# Patient Record
Sex: Female | Born: 1945 | Race: White | Hispanic: No | Marital: Married | Smoking: Former smoker
Health system: Southern US, Community
[De-identification: ages and names within clinical notes are randomized; demographics above are authoritative.]

## PROBLEM LIST (undated history)

## (undated) HISTORY — PX: THYROIDECTOMY: SHX17

---

## 1991-07-06 HISTORY — PX: ABDOMINAL HYSTERECTOMY: SHX81

## 2010-05-22 ENCOUNTER — Emergency Department (HOSPITAL_COMMUNITY): Admission: EM | Admit: 2010-05-22 | Discharge: 2010-05-23 | Payer: Self-pay | Admitting: Emergency Medicine

## 2010-05-24 ENCOUNTER — Ambulatory Visit: Payer: Self-pay | Admitting: Pulmonary Disease

## 2010-05-24 ENCOUNTER — Inpatient Hospital Stay (HOSPITAL_COMMUNITY)
Admission: EM | Admit: 2010-05-24 | Discharge: 2010-05-26 | Payer: Self-pay | Source: Home / Self Care | Admitting: Internal Medicine

## 2010-05-24 ENCOUNTER — Encounter: Payer: Self-pay | Admitting: Emergency Medicine

## 2010-05-26 ENCOUNTER — Encounter: Payer: Self-pay | Admitting: Internal Medicine

## 2010-09-15 LAB — BLOOD GAS, ARTERIAL
O2 Saturation: 90.5 %
Patient temperature: 98.6
TCO2: 23.4 mmol/L (ref 0–100)
pH, Arterial: 7.495 — ABNORMAL HIGH (ref 7.350–7.400)

## 2010-09-15 LAB — COMPREHENSIVE METABOLIC PANEL
ALT: 26 U/L (ref 0–35)
AST: 27 U/L (ref 0–37)
Alkaline Phosphatase: 53 U/L (ref 39–117)
CO2: 30 mEq/L (ref 19–32)
Chloride: 101 mEq/L (ref 96–112)
Creatinine, Ser: 0.94 mg/dL (ref 0.4–1.2)
GFR calc Af Amer: >60 mL/min (ref >60)
GFR calc non Af Amer: 60 mL/min — ABNORMAL LOW (ref >60)
Potassium: 5.2 mEq/L — ABNORMAL HIGH (ref 3.5–5.1)
Sodium: 141 mEq/L (ref 135–145)
Total Bilirubin: 0.5 mg/dL (ref 0.3–1.2)

## 2010-09-15 LAB — BASIC METABOLIC PANEL
BUN: 10 mg/dL (ref 6–23)
BUN: 20 mg/dL (ref 6–23)
CO2: 27 mEq/L (ref 19–32)
Calcium: 9.3 mg/dL (ref 8.4–10.5)
Calcium: 9.3 mg/dL (ref 8.4–10.5)
Calcium: 9.6 mg/dL (ref 8.4–10.5)
Creatinine, Ser: 0.93 mg/dL (ref 0.4–1.2)
GFR calc Af Amer: >60 mL/min (ref >60)
GFR calc non Af Amer: 49 mL/min — ABNORMAL LOW (ref >60)
GFR calc non Af Amer: >60 mL/min (ref >60)
GFR calc non Af Amer: >60 mL/min (ref >60)
Glucose, Bld: 105 mg/dL — ABNORMAL HIGH (ref 70–99)
Glucose, Bld: 107 mg/dL — ABNORMAL HIGH (ref 70–99)
Glucose, Bld: 147 mg/dL — ABNORMAL HIGH (ref 70–99)
Sodium: 138 mEq/L (ref 135–145)

## 2010-09-15 LAB — DIFFERENTIAL
Basophils Absolute: 0.1 10*3/uL (ref 0.0–0.1)
Basophils Relative: 1 % (ref 0–1)
Monocytes Absolute: 0.7 10*3/uL (ref 0.1–1.0)
Neutro Abs: 7.9 10*3/uL — ABNORMAL HIGH (ref 1.7–7.7)
Neutrophils Relative %: 75 % (ref 43–77)

## 2010-09-15 LAB — CBC
HCT: 39.3 % (ref 36.0–46.0)
Hemoglobin: 13.3 g/dL (ref 12.0–15.0)
MCH: 29.3 pg (ref 26.0–34.0)
MCH: 29.5 pg (ref 26.0–34.0)
MCHC: 33 g/dL (ref 30.0–36.0)
MCHC: 33.5 g/dL (ref 30.0–36.0)
MCHC: 35 g/dL (ref 30.0–36.0)
Platelets: 343 10*3/uL (ref 150–400)
RBC: 4.54 MIL/uL (ref 3.87–5.11)
RDW: 14.8 % (ref 11.5–15.5)
RDW: 14.9 % (ref 11.5–15.5)

## 2010-09-15 LAB — D-DIMER, QUANTITATIVE: D-Dimer, Quant: 0.22 ug/mL-FEU (ref 0.00–0.48)

## 2010-09-24 ENCOUNTER — Other Ambulatory Visit (HOSPITAL_COMMUNITY): Payer: Self-pay | Admitting: Surgery

## 2010-09-24 DIAGNOSIS — IMO0001 Reserved for inherently not codable concepts without codable children: Secondary | ICD-10-CM

## 2010-09-25 ENCOUNTER — Ambulatory Visit (HOSPITAL_COMMUNITY)
Admission: RE | Admit: 2010-09-25 | Discharge: 2010-09-25 | Disposition: A | Discharge status: Home / Self Care | Payer: No Typology Code available for payment source | Source: Ambulatory Visit | Attending: Surgery | Admitting: Surgery

## 2010-09-25 ENCOUNTER — Other Ambulatory Visit (HOSPITAL_COMMUNITY): Payer: Self-pay | Admitting: Surgery

## 2010-09-25 DIAGNOSIS — R109 Unspecified abdominal pain: Secondary | ICD-10-CM | POA: Insufficient documentation

## 2010-09-25 DIAGNOSIS — IMO0001 Reserved for inherently not codable concepts without codable children: Secondary | ICD-10-CM

## 2010-09-25 DIAGNOSIS — K219 Gastro-esophageal reflux disease without esophagitis: Secondary | ICD-10-CM | POA: Insufficient documentation

## 2010-12-01 ENCOUNTER — Encounter (HOSPITAL_COMMUNITY): Payer: No Typology Code available for payment source

## 2010-12-01 ENCOUNTER — Other Ambulatory Visit (INDEPENDENT_AMBULATORY_CARE_PROVIDER_SITE_OTHER): Payer: Self-pay | Admitting: Surgery

## 2010-12-01 LAB — DIFFERENTIAL
Basophils Relative: 0 % (ref 0–1)
Eosinophils Absolute: 0.1 10*3/uL (ref 0.0–0.7)
Monocytes Relative: 9 % (ref 3–12)
Neutro Abs: 5.8 10*3/uL (ref 1.7–7.7)
Neutrophils Relative %: 70 % (ref 43–77)

## 2010-12-01 LAB — SURGICAL PCR SCREEN
MRSA, PCR: NEGATIVE
Staphylococcus aureus: NEGATIVE

## 2010-12-01 LAB — CBC
Hemoglobin: 15.5 g/dL — ABNORMAL HIGH (ref 12.0–15.0)
MCH: 28.3 pg (ref 26.0–34.0)
Platelets: 262 10*3/uL (ref 150–400)
RBC: 5.48 MIL/uL — ABNORMAL HIGH (ref 3.87–5.11)
WBC: 8.3 10*3/uL (ref 4.0–10.5)

## 2010-12-01 LAB — COMPREHENSIVE METABOLIC PANEL
ALT: 31 U/L (ref 0–35)
AST: 17 U/L (ref 0–37)
Albumin: 3.7 g/dL (ref 3.5–5.2)
Calcium: 9.5 mg/dL (ref 8.4–10.5)
Creatinine, Ser: 0.87 mg/dL (ref 0.4–1.2)
GFR calc Af Amer: >60 mL/min (ref >60)
GFR calc non Af Amer: >60 mL/min (ref >60)
Sodium: 137 mEq/L (ref 135–145)
Total Protein: 7 g/dL (ref 6.0–8.3)

## 2010-12-01 LAB — APTT: aPTT: 23 seconds — ABNORMAL LOW (ref 24–37)

## 2010-12-01 LAB — PROTIME-INR
INR: 0.92 (ref 0.00–1.49)
Prothrombin Time: 12.6 seconds (ref 11.6–15.2)

## 2010-12-01 LAB — CEA: CEA: 2.8 ng/mL (ref 0.0–5.0)

## 2010-12-02 ENCOUNTER — Observation Stay (HOSPITAL_COMMUNITY)
Admission: RE | Admit: 2010-12-02 | Discharge: 2010-12-05 | DRG: 419 | Disposition: A | Discharge status: Home / Self Care | Payer: No Typology Code available for payment source | Source: Ambulatory Visit | Attending: Surgery | Admitting: Surgery

## 2010-12-02 ENCOUNTER — Other Ambulatory Visit (INDEPENDENT_AMBULATORY_CARE_PROVIDER_SITE_OTHER): Payer: Self-pay | Admitting: Surgery

## 2010-12-02 ENCOUNTER — Ambulatory Visit (HOSPITAL_COMMUNITY): Payer: No Typology Code available for payment source

## 2010-12-02 DIAGNOSIS — K219 Gastro-esophageal reflux disease without esophagitis: Principal | ICD-10-CM | POA: Insufficient documentation

## 2010-12-02 DIAGNOSIS — R05 Cough: Secondary | ICD-10-CM | POA: Insufficient documentation

## 2010-12-02 DIAGNOSIS — K8020 Calculus of gallbladder without cholecystitis without obstruction: Secondary | ICD-10-CM | POA: Insufficient documentation

## 2010-12-02 DIAGNOSIS — Z79899 Other long term (current) drug therapy: Secondary | ICD-10-CM | POA: Insufficient documentation

## 2010-12-02 DIAGNOSIS — J3800 Paralysis of vocal cords and larynx, unspecified: Secondary | ICD-10-CM | POA: Insufficient documentation

## 2010-12-02 DIAGNOSIS — J45909 Unspecified asthma, uncomplicated: Secondary | ICD-10-CM | POA: Insufficient documentation

## 2010-12-02 DIAGNOSIS — R059 Cough, unspecified: Secondary | ICD-10-CM | POA: Insufficient documentation

## 2010-12-02 DIAGNOSIS — IMO0002 Reserved for concepts with insufficient information to code with codable children: Secondary | ICD-10-CM | POA: Insufficient documentation

## 2010-12-02 DIAGNOSIS — Z01812 Encounter for preprocedural laboratory examination: Secondary | ICD-10-CM | POA: Insufficient documentation

## 2010-12-02 DIAGNOSIS — Z85850 Personal history of malignant neoplasm of thyroid: Secondary | ICD-10-CM | POA: Insufficient documentation

## 2010-12-02 LAB — POTASSIUM: Potassium: 3 mEq/L — ABNORMAL LOW (ref 3.5–5.1)

## 2010-12-03 ENCOUNTER — Inpatient Hospital Stay (HOSPITAL_COMMUNITY): Payer: No Typology Code available for payment source

## 2010-12-03 LAB — CBC
Hemoglobin: 13.5 g/dL (ref 12.0–15.0)
MCH: 28.6 pg (ref 26.0–34.0)
MCHC: 32.7 g/dL (ref 30.0–36.0)
Platelets: 227 10*3/uL (ref 150–400)

## 2010-12-03 MED ORDER — IOHEXOL 300 MG/ML  SOLN
50.0000 mL | Freq: Once | INTRAMUSCULAR | Status: AC | PRN
  Administered 2010-12-03: 50 mL via INTRAVENOUS

## 2010-12-04 LAB — DIFFERENTIAL
Basophils Absolute: 0 10*3/uL (ref 0.0–0.1)
Basophils Relative: 0 % (ref 0–1)
Eosinophils Absolute: 0 10*3/uL (ref 0.0–0.7)
Monocytes Relative: 5 % (ref 3–12)
Neutro Abs: 6 10*3/uL (ref 1.7–7.7)
Neutrophils Relative %: 88 % — ABNORMAL HIGH (ref 43–77)

## 2010-12-04 LAB — CBC
Hemoglobin: 13.1 g/dL (ref 12.0–15.0)
Platelets: 209 10*3/uL (ref 150–400)
RBC: 4.51 MIL/uL (ref 3.87–5.11)
WBC: 6.8 10*3/uL (ref 4.0–10.5)

## 2010-12-04 LAB — BASIC METABOLIC PANEL
CO2: 28 mEq/L (ref 19–32)
Chloride: 102 mEq/L (ref 96–112)
Creatinine, Ser: 0.65 mg/dL (ref 0.4–1.2)
GFR calc Af Amer: >60 mL/min (ref >60)
Potassium: 4.2 mEq/L (ref 3.5–5.1)
Sodium: 138 mEq/L (ref 135–145)

## 2010-12-16 NOTE — H&P (Signed)
[name], Hinton          ACCOUNT NO.:  [id number]  MEDICAL RECORD NO.:  [id number]           PATIENT TYPE:  I  LOCATION:  1532                         FACILITY:  [facility]  PHYSICIAN:  Thornton Park. Daphine Deutscher, MD  DATE OF BIRTH:  July 24, 1945  DATE OF ADMISSION:  12/02/2010 DATE OF DISCHARGE:                             HISTORY & PHYSICAL   CHIEF COMPLAINT:  Gastroesophageal induced asthma.  HISTORY:  Desiree Hinton is a 65 year old white female who currently lives in [place], [place], and is followed by Dr. Virgina Evener in [place], [place], who has had reflux for some time of the laryngopharyngeal kind.  This has aggravated a problem from a previous completion thyroidectomy for papillary follicular cancer that left Desiree Hinton with some vocal cord weakness.  Desiree Hinton has had this probably GERD induced asthma for which Desiree Hinton has taken fairly significant dose of prednisone, aggravated earlier this year being as much as 60 mg a day, currently down to around 20 a day.  Desiree Hinton daughter works in the [employer] and Desiree Hinton desired have a Nissen down here in [place].  Preoperative upper GI showed free reflux but no significant structural hiatal hernia.  Desiree Hinton also on ultrasound was found to have gallstones.  We discussed laparoscopic cholecystectomy and laparoscopic Nissen fundoplication in the holding area at the time  before surgery and we decided to proceed with both.  Informed consent was obtained.  PAST MEDICAL HISTORY:  Desiree Hinton has had previous total thyroidectomy in March and subsequently in July 2008.  Desiree Hinton has had bilateral knee replacements in [place].  Desiree Hinton had a hysterectomy in 1993 and Desiree Hinton has required ICU admissions for significant aspiration issues.  CURRENT MEDICATIONS:  Include hydrochlorothiazide 50 mg daily, Synthroid 150 mcg daily, Nexium 40 mg b.i.d., ranitidine 300 mg h.s. Symbicort 2 puffs b.i.d., prednisone 20 mg daily, estradiol patch 0.025 mg weekly.  ALLERGIES:  Desiree Hinton  lists to [facility] which produce vomiting issues; THEOPHYLLINE, tremors and headaches.  Desiree Hinton also was taking the brand Synthroid and another generic substitute. Desiree Hinton is not allergic to latex.  Does not take Coumadin, Plavix or aspirin.  FAMILY HISTORY:  Father deceased at age 13.  Mother is living, healthy, mild memory impairment.  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  Desiree Hinton has 2  children.  Does not smoke and drink.  Fifteen-point review of systems is remarkable for cough, shortness of breath, thyroid disease, sinus problems, hoarseness and previous thyroid cancer.  Also significant GERD as outlined earlier in the HPI.  PHYSICAL EXAMINATION:  VITAL SIGNS:  Physical examination shows Desiree Hinton is afebrile, pulse rate 75, blood pressure 150/80. GENERAL:  Well-developed, pleasant white female, in no acute distress. Desiree Hinton does have somewhat residual cough. HEENT EXAM:  Unremarkable. NECK:  Had previous thyroidectomy incision. CHEST:  Clear to auscultation today. HEART:  Sinus rhythm without murmurs, rubs or gallops. ABDOMEN:  Nontender.  Desiree Hinton has few scars from previous laparoscopy but no upper abdominal incisions. EXTREMITIES:  Have full range of motion.  Has had bilateral knee replacements. NEURO:  Alert, oriented x3.  Motor and sensory function grossly intact. PSYCH:  Appropriate affect.  Good fund  of knowledge.  Has now researched the procedure well.  IMPRESSION:  Significant gastroesophageal reflux in a patient with partial vocal cord paralysis who is prone to and has developed significant gastroesophageal reflux disease induced asthma.  Plan is laparoscopic Nissen fundoplication.  Also gallstones with a band-like pains suggesting symptomatic cholelithiasis.  Plan is laparoscopic cholecystectomy.     Thornton Park Daphine Deutscher, MD     MBM/MEDQ  D:  12/03/2010  T:  12/03/2010  Job:  [id number]  Electronically Signed by Luretha Murphy MD on 12/16/2010 04:32:50  PM

## 2010-12-16 NOTE — Op Note (Signed)
NAME:  NEISHA, HINGER          ACCOUNT NO.:  [id number]  MEDICAL RECORD NO.:  [id number]           PATIENT TYPE:  O  LOCATION:  DAYL                         FACILITY:  [facility]  PHYSICIAN:  Thornton Park. Daphine Deutscher, MD  DATE OF BIRTH:  [birth year]  DATE OF PROCEDURE:  12/02/2010 DATE OF DISCHARGE:                              OPERATIVE REPORT   PREOPERATIVE INDICATIONS:  Laryngopharyngeal reflux with recurrent aspiration and gallstones.  HISTORY:  Ms. Kramar is a 65 year old white female from [place] who I saw in the office on September 24, 2010, regarding her laryngopharyngeal reflux.  She had a previous thyroidectomy for papillary follicular cancer leaving her worse with vocal cord weakness.  In addition, she has had aspirations that have become very symptomatic and this led to aspiration pneumonia and adult onset asthma-like problems.  Informed consent was obtained regarding fundoplication.  In light of her gallstones, which were found on some of the tests that I ordered, we talked in the holding area about going ahead and doing a laparoscopic cholecystectomy at the same time and she wanted to do this.  PROCEDURE:  Laparoscopic Nissen fundoplication with a 56 bougie in place, 1 suture pledgeted closure of the hiatus having been calibrated with a 56 and a 3 suture wrap.  Laparoscopic cholecystectomy with an intraoperative cholangiogram.  SURGEON:  Thornton Park. Daphine Deutscher, MD  ASSISTANT:  Lorne Skeens. Hoxworth, MD  ANESTHESIA:  General endotracheal.  DESCRIPTION OF PROCEDURE:  This lady was taken to room #1 on Wednesday, Dec 02, 2010, and given general anesthesia.  The abdomen was prepped with PCMX and draped sterilely.  I entered the abdomen through the left upper quadrant using a 5 mm 0-degree Optiview technique without difficulty.  The abdomen was insufflated and standard trocar placements were used initially for the Nissen portion of the procedure.  I used an 11 on the right side and  a 5 more laterally.  A Nathanson retractor was inserted to achieve visibility beneath the liver.  I used a Harmonic scalpel to take down the gastrohepatic window and go up on the top of the esophagus.  I went over and took down short gastrics working my way up to the left crus and opened that up as well, identifying the left and right crus.  I went posteriorly and created a window, put a Penrose drain around and held down on this to complete the dissection.  We then inserted a lighted 56 bougie, and with that in place, I used that to help me calibrate the closure of the hiatus and this just took 1 stitch with pledgets on both sides secured with a tie knot.  Wrap was brought around over the 56 and again secured with 3 sutures getting purchases of esophagus along with each and then it was secured with a tie knot.  The bougie was withdrawn and the repair looked good.  We were able to grasp the gallbladder with the lateral most trocar on the right and elevate it.  We then achieved a critical view and I dissected free Calot triangle.  The gallbladder itself had lot of fatty infiltration, was kind of white, opalescent.  Cystic  duct was identified, clipped proximally on the gallbladder, incised and a Reddick catheter was inserted.  A dynamic cholangiogram revealed prompt filling of the intrahepatic radicals and free flow into the duodenum.  No stones were noted.  Cystic duct was triple clipped, divided.  Cystic artery was triple clipped and divided with the hook electrocautery.  The gallbladder was then teased away from the gallbladder and dissected out with a right-angle Bovie and it was not entered.  It was then placed in a bag and brought out through the 10 mm port.  The port sites were injected with a new long-acting analgesic __________.  The wounds were closed with 4-0 Vicryl with Benzoin and Steri-Strips.  The patient tolerated the procedure well and was taken to recovery room in  satisfactory condition.     Thornton Park Daphine Deutscher, MD     MBM/MEDQ  D:  12/02/2010  T:  12/02/2010  Job:  [id number]  cc:   Dr. Tedd Sias, [place]  Electronically Signed by Luretha Murphy MD on 12/16/2010 04:32:43 PM

## 2011-01-05 NOTE — Discharge Summary (Signed)
  NAMESHALINA, NORFOLK NO.:  [id number]  MEDICAL RECORD NO.:  [id number]  LOCATION:  1532                         FACILITY:  [facility]  PHYSICIAN:  Thornton Park. Daphine Deutscher, MD  DATE OF BIRTH:  1945-11-30  DATE OF ADMISSION:  12/02/2010 DATE OF DISCHARGE:  12/05/2010                              DISCHARGE SUMMARY   ADMITTING DIAGNOSIS:  Gastroesophageal reflux disease.  POSTOPERATIVE DIAGNOSIS:  Gastroesophageal reflux disease.  PROCEDURE:  Laparoscopic Nissen fundoplication.  HISTORY:  This 65 year old white female comes to [place] from [place], [place] for Nissen fundoplication.  She was taken to the operating room and the above mentioned operation done without incident.  Postoperatively, she had a swallow, which looked good and she was started on clear liquids, which she tolerated.  She was advanced to usual post Nissen diet, which is basically pureed and liquids and was ready for discharge. Condition improved.  Followup arrangements will be made to see her in the office prior to return to [place], [place].     Thornton Park Daphine Deutscher, MD     MBM/MEDQ  D:  12/16/2010  T:  12/16/2010  Job:  [id number]  Electronically Signed by Luretha Murphy MD on 01/05/2011 10:15:55 AM

## 2011-01-07 ENCOUNTER — Encounter (INDEPENDENT_AMBULATORY_CARE_PROVIDER_SITE_OTHER): Payer: Self-pay | Admitting: General Surgery

## 2011-01-08 ENCOUNTER — Encounter (INDEPENDENT_AMBULATORY_CARE_PROVIDER_SITE_OTHER): Payer: Self-pay | Admitting: Surgery

## 2011-01-08 ENCOUNTER — Ambulatory Visit (INDEPENDENT_AMBULATORY_CARE_PROVIDER_SITE_OTHER): Payer: PRIVATE HEALTH INSURANCE | Admitting: Surgery

## 2011-01-08 VITALS — Temp 97.4°F

## 2011-01-08 DIAGNOSIS — Z09 Encounter for follow-up examination after completed treatment for conditions other than malignant neoplasm: Secondary | ICD-10-CM

## 2011-01-08 DIAGNOSIS — Z9889 Other specified postprocedural states: Secondary | ICD-10-CM

## 2011-01-08 NOTE — Progress Notes (Signed)
Ms. Desiree Hinton returns for a postop visit. She is 5 weeks out from her laparoscopic Nissen fundoplication and repair of a hiatal hernia. She is doing very well. She reports no reflux. Her asthma flared up last week .  Her incisions are healed nicely. Overall she is very pleased with her perioperative progress. I'm pleased with how she is doing as well. She will return to [facility]. I told her to return to see me when she down visiting her daughter. I will try to get records back to Dr. Virgina Evener D.O. [place].

## 2011-08-24 ENCOUNTER — Telehealth (INDEPENDENT_AMBULATORY_CARE_PROVIDER_SITE_OTHER): Payer: Self-pay | Admitting: Surgery

## 2011-08-24 NOTE — Telephone Encounter (Signed)
Patient was told by Dr MM to call and make an appointment next time she would be coming down to [place], she will be her the week of 09/27/11.  Please call, she can be reached at the number given today or sometime next week.

## 2011-09-30 ENCOUNTER — Ambulatory Visit (INDEPENDENT_AMBULATORY_CARE_PROVIDER_SITE_OTHER): Payer: Medicare Other | Admitting: Surgery

## 2011-12-09 ENCOUNTER — Ambulatory Visit (INDEPENDENT_AMBULATORY_CARE_PROVIDER_SITE_OTHER): Payer: Medicare Other | Admitting: Surgery

## 2011-12-09 ENCOUNTER — Encounter (INDEPENDENT_AMBULATORY_CARE_PROVIDER_SITE_OTHER): Payer: Self-pay | Admitting: Surgery

## 2011-12-09 VITALS — BP 126/78 | HR 70 | Temp 97.1°F | Resp 14 | Ht 68.0 in | Wt 192.5 lb

## 2011-12-09 DIAGNOSIS — Z9889 Other specified postprocedural states: Secondary | ICD-10-CM

## 2011-12-09 DIAGNOSIS — Z931 Gastrostomy status: Secondary | ICD-10-CM

## 2011-12-09 NOTE — Progress Notes (Signed)
[name] 66 y.o.  Body mass index is 29.27 kg/(m^2).  There is no problem list on file for this patient.   Allergies  Allergen Reactions  . Daypro (Oxaprozin) Nausea And Vomiting  . Theophyllines     Past Surgical History  Procedure Date  . Thyroidectomy     bilateral  . Abdominal hysterectomy 1993   No primary provider on file. No diagnosis found.  Desiree Hinton returns today after a year from her Nissen fundoplication. Unfortunately, earlier this year she developed a severe sinus infection that has led to a lot of postnasal drip. This has started her with very forceful coughing to the point where she's had rib bruising and very recently has noticed a change in her ability to burp. She is concerned that her Nissen may be looser. This has functioned very well up until recently so I'm having suspected she has a loosened wrap and what to get an upper GI series on her to check that. She will be here for graduation for her grandson through the weekend and I was try to get one today or tomorrow to assess her wrap.  If we can't get it here, then we will request one in [place].  She is a good look at her wrap and contrast [place].Matt B. Daphine Deutscher, MD, [facility] [phone] beeper [phone]  12/09/2011 11:10 AM

## 2011-12-10 ENCOUNTER — Ambulatory Visit
Admission: RE | Admit: 2011-12-10 | Discharge: 2011-12-10 | Disposition: A | Discharge status: Home / Self Care | Payer: No Typology Code available for payment source | Source: Ambulatory Visit | Attending: Surgery | Admitting: Surgery

## 2011-12-10 DIAGNOSIS — Z9889 Other specified postprocedural states: Secondary | ICD-10-CM

## 2012-12-18 IMAGING — RF DG UGI W/ HIGH DENSITY W/KUB
19 of 24 series · 19 of 24 positions shown · non-contrast
Comparison: 12/03/2010

CLINICAL DATA: Complex symptoms, including worsening cough.
Status post Nissen fundoplication.  Evaluation for fundoplication
slip.

UPPER GI SERIES WITH KUB
TECHNIQUE: Routine upper GI series was performed with thin and
high density barium.
Fluoroscopy Time: 4.6 minutes

[Series 1: run · 1 of 7 slices shown (1 of 19)]
[im 1/7]
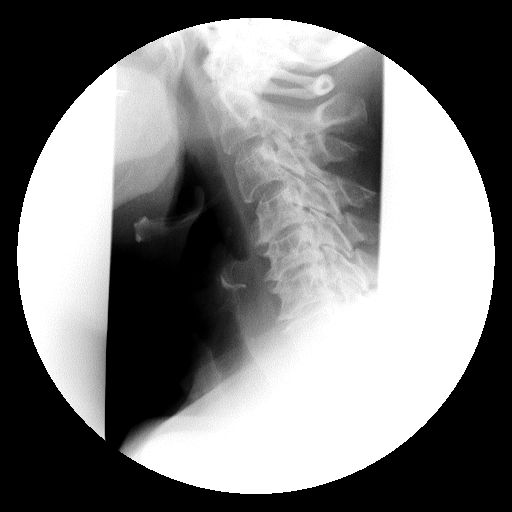

[Series 2: run · 1 of 1 slices shown (2 of 19)]
[im 1/1]
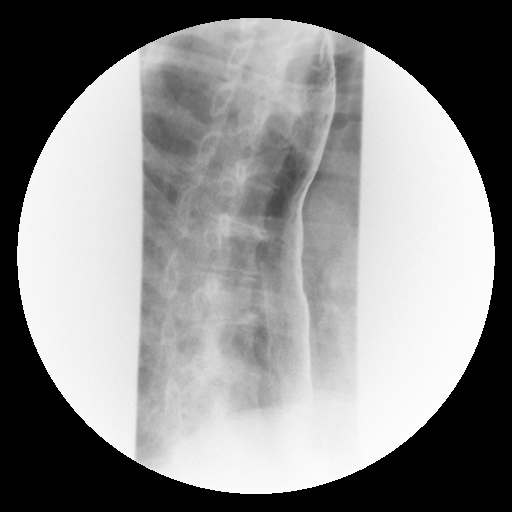

[Series 4: run · 1 of 1 slices shown (3 of 19)]
[im 1/1]
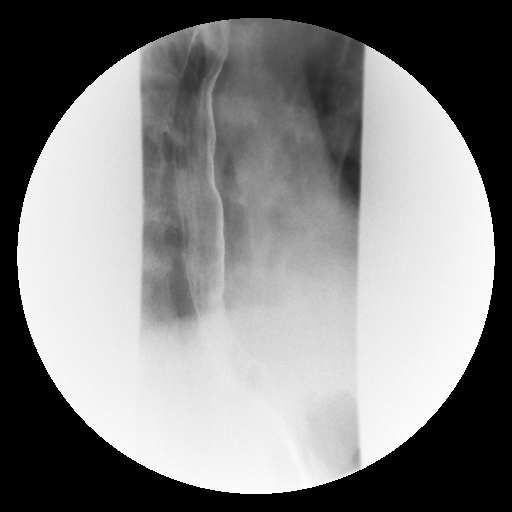

[Series 5: run · 1 of 1 slices shown (4 of 19)]
[im 1/1]
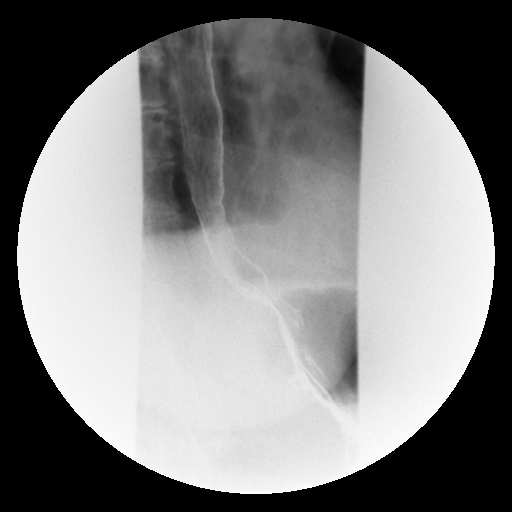

[Series 6: run · 1 of 1 slices shown (5 of 19)]
[im 1/1]
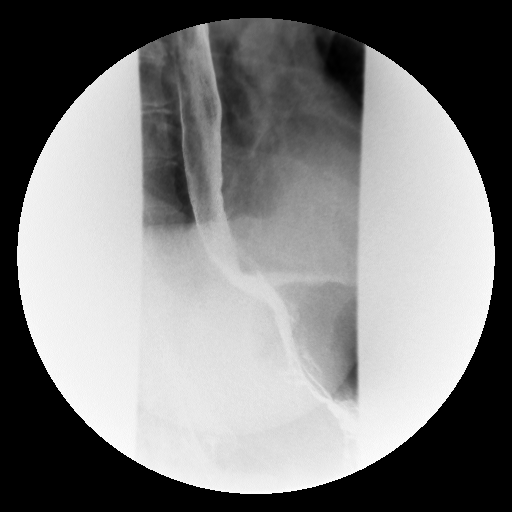

[Series 7: run · 1 of 1 slices shown (6 of 19)]
[im 1/1]
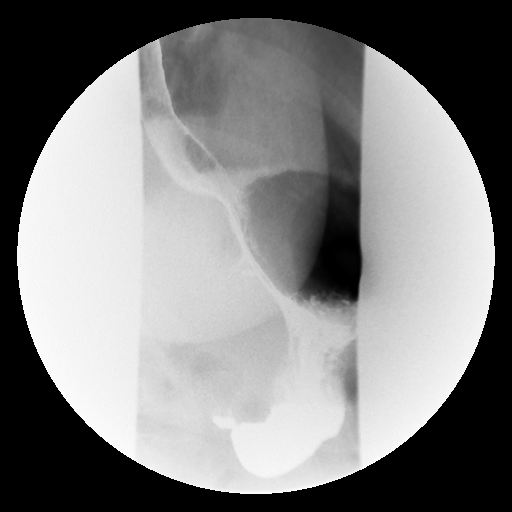

[Series 9: run · 1 of 1 slices shown (7 of 19)]
[im 1/1]
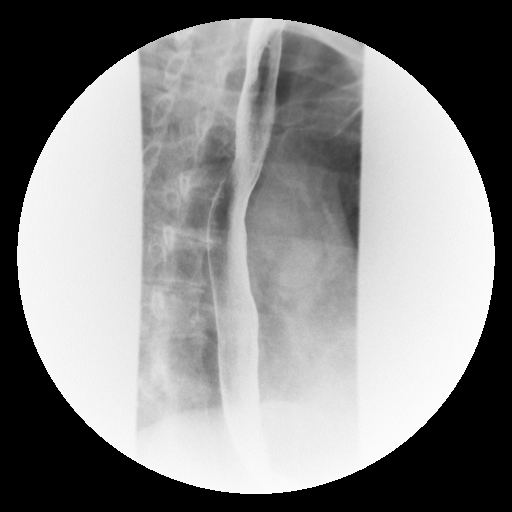

[Series 10: run · 1 of 1 slices shown (8 of 19)]
[im 1/1]
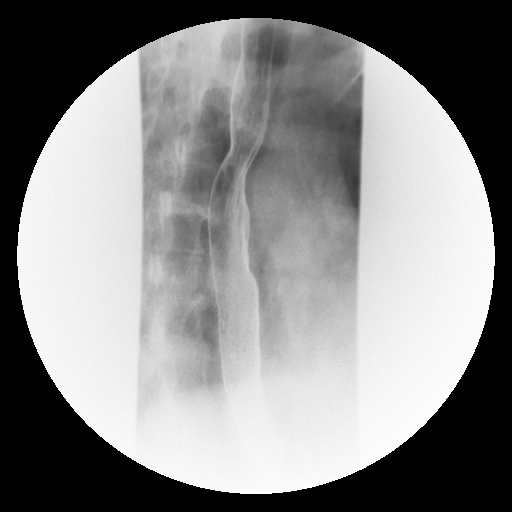

[Series 11: run · 1 of 1 slices shown (9 of 19)]
[im 1/1]
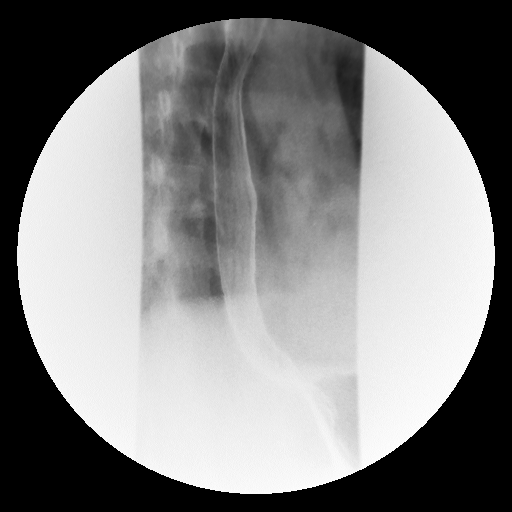

[Series 13: run · 1 of 1 slices shown (10 of 19)]
[im 1/1]
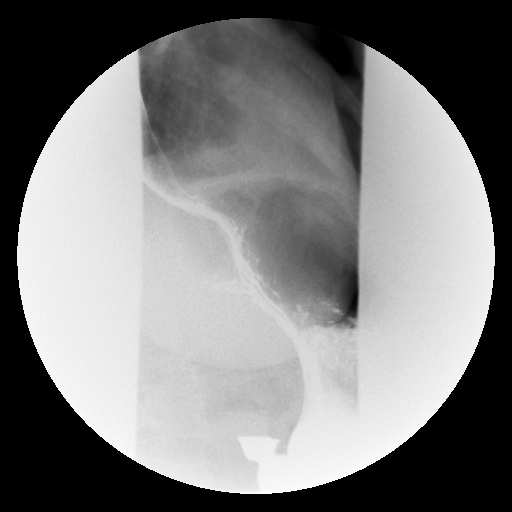

[Series 14: run · 1 of 1 slices shown (11 of 19)]
[im 1/1]
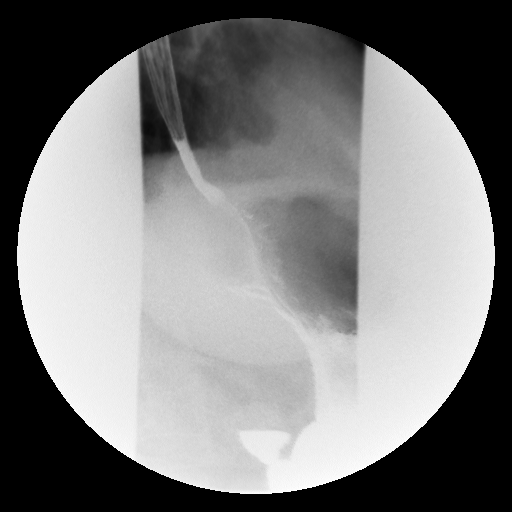

[Series 15: run · 1 of 1 slices shown (12 of 19)]
[im 1/1]
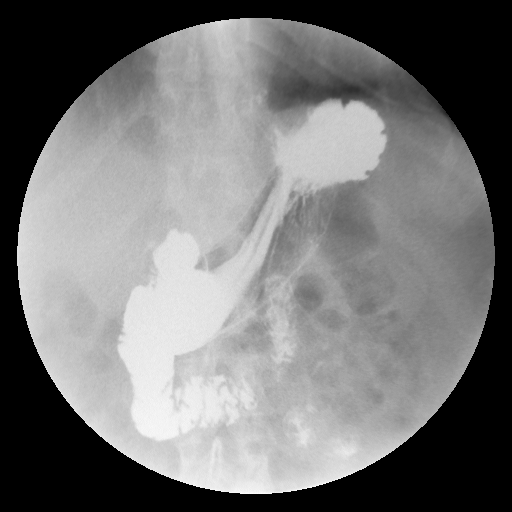

[Series 16: run · 1 of 1 slices shown (13 of 19)]
[im 1/1]
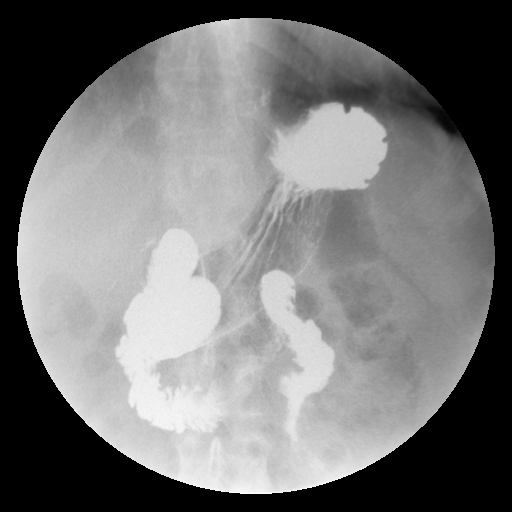

[Series 18: run · 1 of 1 slices shown (14 of 19)]
[im 1/1]
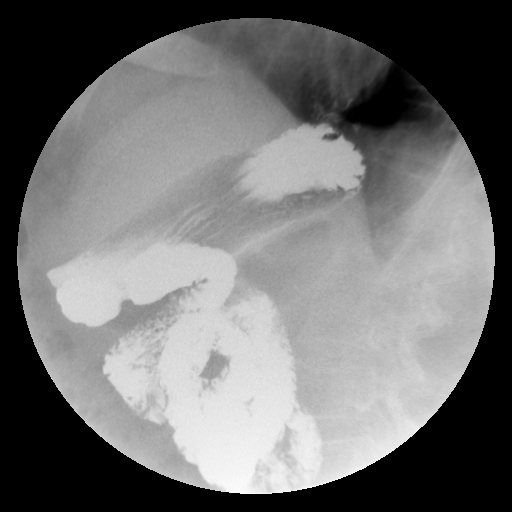

[Series 19: run · 1 of 1 slices shown (15 of 19)]
[im 1/1]
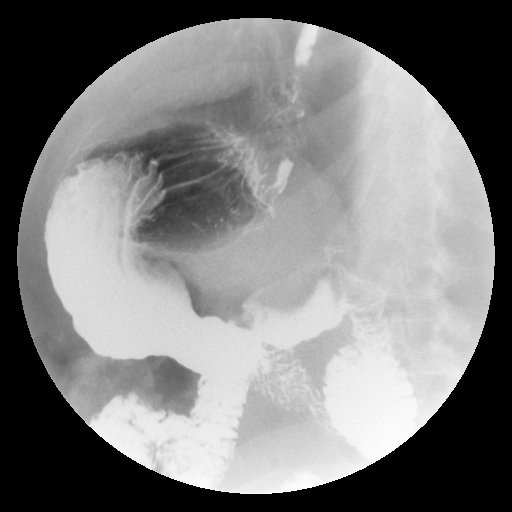

[Series 20: run · 1 of 1 slices shown (16 of 19)]
[im 1/1]
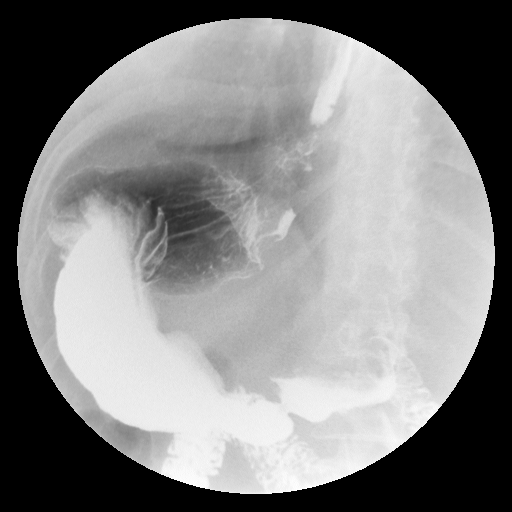

[Series 21: run · 1 of 1 slices shown (17 of 19)]
[im 1/1]
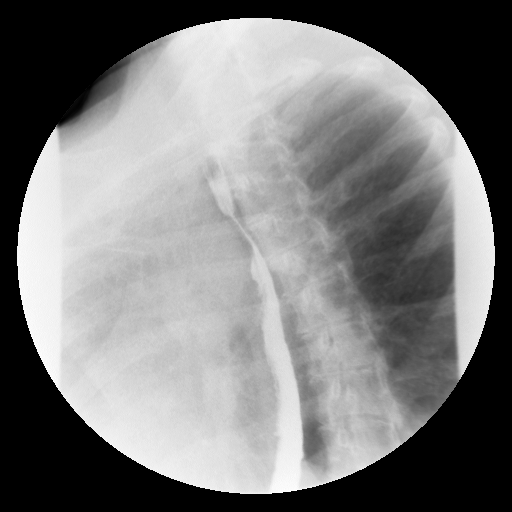

[Series 23: run · 1 of 1 slices shown (18 of 19)]
[im 1/1]
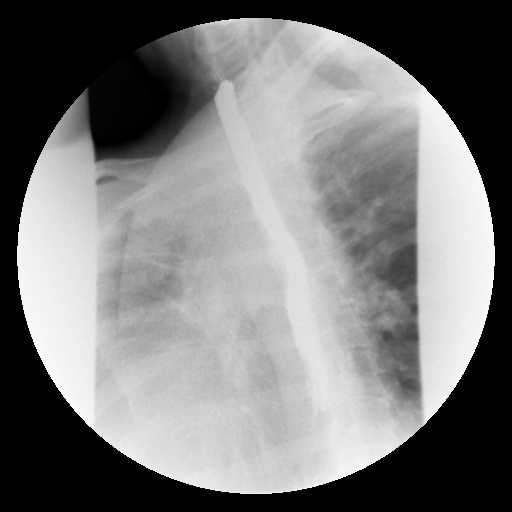

[Series 24: run · 1 of 1 slices shown (19 of 19)]
[im 1/1]
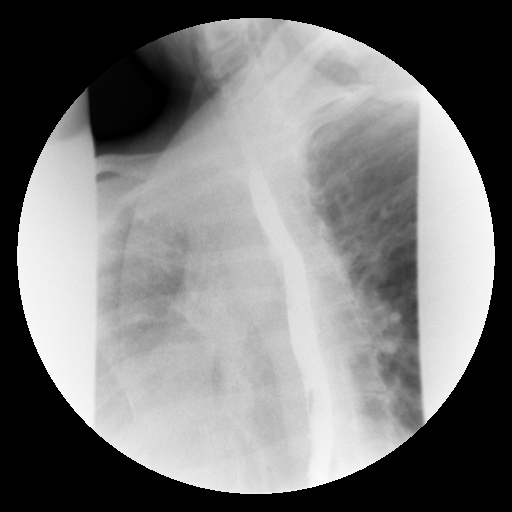

[19 of 24 positions shown; findings below may reference images not displayed]

FINDINGS: Pre procedure scout film demonstrates a nonobstructive
bowel gas pattern.  Probable cholecystectomy clips.

Hypopharyngeal portion of the exam demonstrates no evidence of
aspiration.

Double contrast evaluation of the esophagus demonstrates no mucosal
abnormality.  Evaluation of primary peristalsis demonstrates an
incomplete peristaltic wave.  Contrast stasis in the lower thoracic
esophagus. Occasional tertiary contractions.

Full column evaluation of the esophagus demonstrates no narrowing
or stricture. A tiny hiatal hernia is identified, including on
series 33.  Series 46.

The remainder of the stomach is normal in appearance.
IMPRESSION: 1.  Tiny hiatal hernia now present just above the area of Nissen
fundoplication.  This suggests mild slip relative to the
fundoplication site.
2.  Esophageal dysmotility, likely presbyesophagus.

## 2021-09-24 ENCOUNTER — Ambulatory Visit
Admission: EM | Admit: 2021-09-24 | Discharge: 2021-09-24 | Disposition: A | Discharge status: Home / Self Care | Payer: Medicare HMO | Attending: Physician Assistant | Admitting: Physician Assistant

## 2021-09-24 DIAGNOSIS — N390 Urinary tract infection, site not specified: Secondary | ICD-10-CM | POA: Diagnosis present

## 2021-09-24 LAB — POCT URINALYSIS DIP (MANUAL ENTRY)
Bilirubin, UA: NEGATIVE
Glucose, UA: 100 mg/dL — AB
Nitrite, UA: POSITIVE — AB
Protein Ur, POC: 30 mg/dL — AB
Spec Grav, UA: 1.025 (ref 1.010–1.025)
Urobilinogen, UA: 1 E.U./dL
pH, UA: 5 (ref 5.0–8.0)

## 2021-09-24 MED ORDER — CIPROFLOXACIN HCL 500 MG PO TABS: 500.0000 mg | ORAL_TABLET | Freq: Two times a day (BID) | ORAL | 0 refills | Status: AC

## 2021-09-24 NOTE — ED Triage Notes (Signed)
Pt presents with concern for uti. She was treated for uti over video visit with macrobid., reports taking it for 7 days. Reports the culture showed macrobid was sensitive to the bacteria. Pt reports that her burning with urination is worse than when it started a week ago.  ?

## 2021-09-24 NOTE — ED Provider Notes (Signed)
?[facility] ? ? ? ?CSN: [id number] ?Arrival date & time: 09/24/21  1605 ? ? ?  ? ?History   ?Chief Complaint ?Chief Complaint  ?Patient presents with  ? Recurrent UTI  ? ? ?HPI ?Desiree Hinton is a 76 y.o. female.  ? ?Patient here today for evaluation of continued dysuria, chills and suspected UTI despite treatment for the last 7 days.  She states that she was seen by her urologist and was prescribed Macrobid but this does not seem to be effective.  Culture did show that bacteria was sensitive to Macrobid however. ? ?The history is provided by the patient.  ? ?History reviewed. No pertinent past medical history. ? ?There are no problems to display for this patient. ? ? ?Past Surgical History:  ?Procedure Laterality Date  ? ABDOMINAL HYSTERECTOMY  1993  ? THYROIDECTOMY    ? bilateral  ? ? ?OB History   ?No obstetric history on file. ?  ? ? ? ?Home Medications   ? ?Prior to Admission medications   ?Medication Sig Start Date End Date Taking? Authorizing Provider  ?ciprofloxacin (CIPRO) 500 MG tablet Take 1 tablet (500 mg total) by mouth every 12 (twelve) hours. 09/24/21  Yes Tomi Bamberger, PA-C  ?esomeprazole (NEXIUM) 40 MG capsule Take 40 mg by mouth daily before breakfast.      [provider]  ?estradiol (VIVELLE-DOT) 0.025 MG/24HR Place 1 patch onto the skin 1 day or 1 dose.      [provider]  ?hydrochlorothiazide 50 MG tablet Take 50 mg by mouth daily.      [provider]  ?Levothyroxine Sodium 112 MCG CAPS Take by mouth daily.    [provider]  ? ? ?Family History ?Family History  ?Family history unknown: Yes  ? ? ?Social History ?Social History  ? ?Tobacco Use  ? Smoking status: Former  ?Substance Use Topics  ? Alcohol use: No  ? Drug use: No  ? ? ? ?Allergies   ?Daypro [oxaprozin] and Theophyllines ? ? ?Review of Systems ?Review of Systems  ?Constitutional:  Positive for chills. Negative for fever.  ?Eyes:  Negative for discharge and redness.   ?Respiratory:  Negative for shortness of breath.   ?Cardiovascular:  Negative for chest pain.  ?Gastrointestinal:  Negative for abdominal pain, nausea and vomiting.  ?Genitourinary:  Positive for dysuria and frequency.  ?Musculoskeletal:  Negative for back pain.  ? ? ?Physical Exam ?Triage Vital Signs ?ED Triage Vitals  ?Enc Vitals Group  ?   BP   ?   Pulse   ?   Resp   ?   Temp   ?   Temp src   ?   SpO2   ?   Weight   ?   Height   ?   Head Circumference   ?   Peak Flow   ?   Pain Score   ?   Pain Loc   ?   Pain Edu?   ?   Excl. in GC?   ? ?No data found. ? ?Updated Vital Signs ?BP 139/78   Pulse 74   Temp 98.7 ?F (37.1 ?C)   Resp 19   SpO2 97%  ?   ? ?Physical Exam ?Vitals and nursing note reviewed.  ?Constitutional:   ?   General: She is not in acute distress. ?   Appearance: Normal appearance. She is not ill-appearing.  ?HENT:  ?   Head: Normocephalic and atraumatic.  ?Eyes:  ?  Conjunctiva/sclera: Conjunctivae normal.  ?Cardiovascular:  ?   Rate and Rhythm: Normal rate.  ?Pulmonary:  ?   Effort: Pulmonary effort is normal.  ?Neurological:  ?   Mental Status: She is alert.  ?Psychiatric:     ?   Mood and Affect: Mood normal.     ?   Behavior: Behavior normal.     ?   Thought Content: Thought content normal.  ? ? ? ?UC Treatments / Results  ?Labs ?(all labs ordered are listed, but only abnormal results are displayed) ?Labs Reviewed  ?POCT URINALYSIS DIP (MANUAL ENTRY) - Abnormal; Notable for the following components:  ?    Result Value  ? Color, UA orange (*)   ? Glucose, UA =100 (*)   ? Ketones, POC UA trace (5) (*)   ? Blood, UA moderate (*)   ? Protein Ur, POC =30 (*)   ? Nitrite, UA Positive (*)   ? Leukocytes, UA Large (3+) (*)   ? All other components within normal limits  ?URINE CULTURE  ? ? ?EKG ? ? ?Radiology ?No results found. ? ?Procedures ?Procedures (including critical care time) ? ?Medications Ordered in UC ?Medications - No data to display ? ?Initial Impression / Assessment and Plan / UC  Course  ?I have reviewed the triage vital signs and the nursing notes. ? ?Pertinent labs & imaging results that were available during my care of the patient were reviewed by me and considered in my medical decision making (see chart for details). ? ?  ?UA likely unreliable due to color interference, but given continued symptoms will treat with Cipro.  Urine culture ordered.  Encouraged follow-up if symptoms fail to improve or worsen, and patient does plan to follow-up with her urologist early next week. ? ?Final Clinical Impressions(s) / UC Diagnoses  ? ?Final diagnoses:  ?Recurrent UTI  ? ?Discharge Instructions   ?None ?  ? ?ED Prescriptions   ? ? Medication Sig Dispense Auth. Provider  ? ciprofloxacin (CIPRO) 500 MG tablet Take 1 tablet (500 mg total) by mouth every 12 (twelve) hours. 10 tablet Tomi Bamberger, PA-C  ? ?  ? ?PDMP not reviewed this encounter. ?  ?Tomi Bamberger, PA-C ?09/24/21 1642 ? ?

## 2021-09-27 LAB — URINE CULTURE: Culture: >=100000 — AB

## 2021-09-28 ENCOUNTER — Telehealth (HOSPITAL_COMMUNITY): Payer: Self-pay | Admitting: Emergency Medicine

## 2021-09-28 MED ORDER — CEPHALEXIN 500 MG PO CAPS: 500.0000 mg | ORAL_CAPSULE | Freq: Two times a day (BID) | ORAL | 0 refills | Status: AC
